# Patient Record
Sex: Female | Born: 1960 | Race: White | Hispanic: No | Marital: Married | Smoking: Never smoker
Health system: Southern US, Community
[De-identification: ages and names within clinical notes are randomized; demographics above are authoritative.]

## PROBLEM LIST (undated history)

## (undated) DIAGNOSIS — I10 Essential (primary) hypertension: Secondary | ICD-10-CM

## (undated) DIAGNOSIS — K219 Gastro-esophageal reflux disease without esophagitis: Secondary | ICD-10-CM

## (undated) DIAGNOSIS — J45909 Unspecified asthma, uncomplicated: Secondary | ICD-10-CM

## (undated) HISTORY — DX: Essential (primary) hypertension: I10

## (undated) HISTORY — DX: Gastro-esophageal reflux disease without esophagitis: K21.9

## (undated) HISTORY — DX: Unspecified asthma, uncomplicated: J45.909

---

## 2002-01-22 ENCOUNTER — Other Ambulatory Visit: Admission: RE | Admit: 2002-01-22 | Discharge: 2002-01-22 | Payer: Self-pay | Admitting: Unknown Physician Specialty

## 2011-02-09 ENCOUNTER — Ambulatory Visit
Admission: RE | Admit: 2011-02-09 | Discharge: 2011-02-09 | Disposition: A | Discharge status: Home / Self Care | Payer: BC Managed Care – PPO | Source: Ambulatory Visit | Attending: Allergy | Admitting: Allergy

## 2011-02-09 ENCOUNTER — Other Ambulatory Visit: Payer: Self-pay | Admitting: Allergy

## 2011-02-09 DIAGNOSIS — J45901 Unspecified asthma with (acute) exacerbation: Secondary | ICD-10-CM

## 2012-03-07 IMAGING — CR DG CHEST 2V
2 series · 2 of 2 positions shown · non-contrast
Comparison: None

CLINICAL DATA: Extrinsic asthma with acute exacerbation, chest
tightness, shortness of breath

CHEST - 2 VIEW

[view not recorded (1 of 2)]
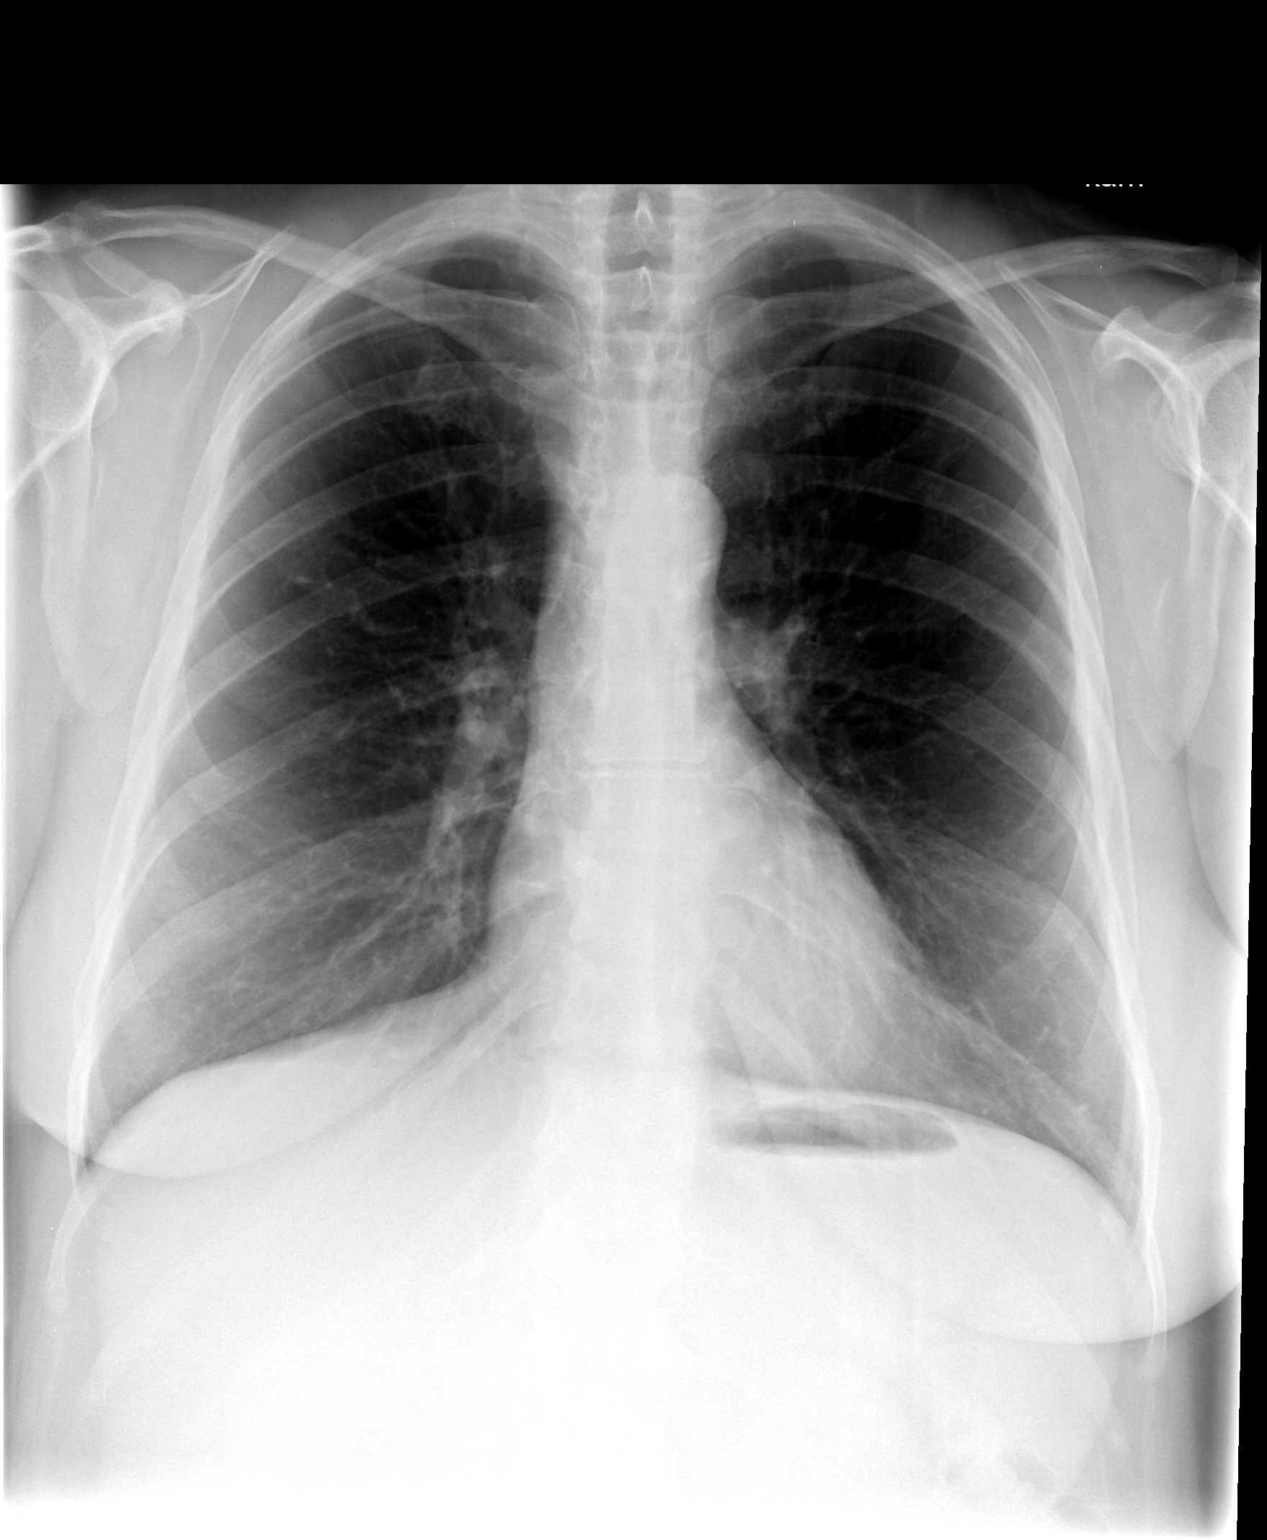

[view not recorded (2 of 2)]
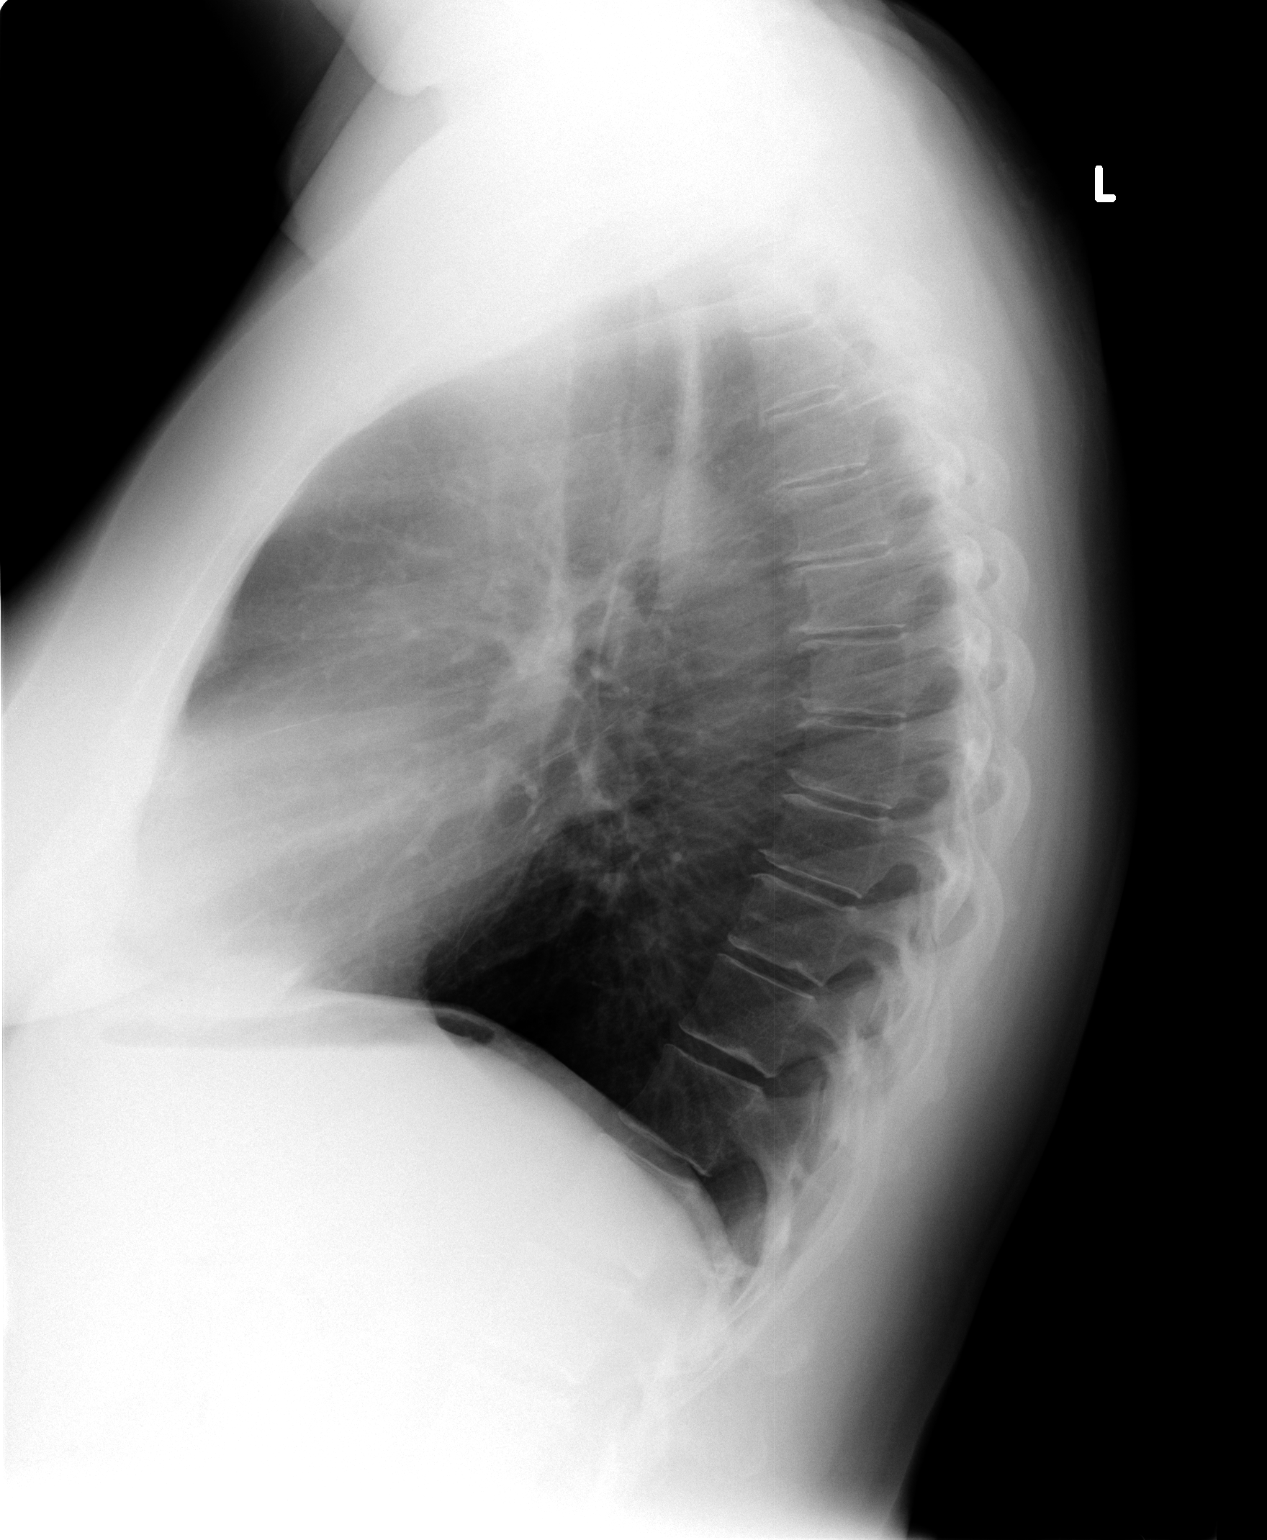

[2 of 2 positions shown; findings below may reference images not displayed]

FINDINGS: Normal heart size, mediastinal contours, and pulmonary vascularity.
Peribronchial thickening.
No pulmonary infiltrate or pleural effusion.
Upper normal lung volumes without significant air trapping.
Minimal scattered end plate spur formation mid thoracic spine.
IMPRESSION: Peribronchial thickening, which can be seen with bronchitis or
reactive airway disease.
No acute infiltrate.

## 2014-12-22 ENCOUNTER — Telehealth: Payer: Self-pay | Admitting: Internal Medicine

## 2014-12-22 NOTE — Telephone Encounter (Signed)
Opened in error

## 2016-12-01 ENCOUNTER — Other Ambulatory Visit: Payer: Self-pay | Admitting: Physician Assistant

## 2016-12-12 DIAGNOSIS — H1045 Other chronic allergic conjunctivitis: Secondary | ICD-10-CM | POA: Diagnosis not present

## 2016-12-12 DIAGNOSIS — J4530 Mild persistent asthma, uncomplicated: Secondary | ICD-10-CM | POA: Diagnosis not present

## 2016-12-12 DIAGNOSIS — J3089 Other allergic rhinitis: Secondary | ICD-10-CM | POA: Diagnosis not present

## 2016-12-12 DIAGNOSIS — J301 Allergic rhinitis due to pollen: Secondary | ICD-10-CM | POA: Diagnosis not present

## 2016-12-29 ENCOUNTER — Other Ambulatory Visit: Payer: Self-pay | Admitting: Physician Assistant

## 2017-01-17 ENCOUNTER — Other Ambulatory Visit: Payer: Self-pay | Admitting: Physician Assistant

## 2017-02-06 ENCOUNTER — Encounter: Payer: Self-pay | Admitting: Physician Assistant

## 2017-02-06 ENCOUNTER — Ambulatory Visit (INDEPENDENT_AMBULATORY_CARE_PROVIDER_SITE_OTHER): Payer: BLUE CROSS/BLUE SHIELD | Admitting: Physician Assistant

## 2017-02-06 VITALS — BP 176/88 | HR 65 | Temp 97.9°F | Ht 63.5 in | Wt 189.2 lb

## 2017-02-06 DIAGNOSIS — K219 Gastro-esophageal reflux disease without esophagitis: Secondary | ICD-10-CM

## 2017-02-06 DIAGNOSIS — F339 Major depressive disorder, recurrent, unspecified: Secondary | ICD-10-CM

## 2017-02-06 DIAGNOSIS — I10 Essential (primary) hypertension: Secondary | ICD-10-CM | POA: Diagnosis not present

## 2017-02-06 DIAGNOSIS — J4532 Mild persistent asthma with status asthmaticus: Secondary | ICD-10-CM

## 2017-02-06 MED ORDER — BUPROPION HCL ER (SR) 150 MG PO TB12: 150.0000 mg | ORAL_TABLET | Freq: Two times a day (BID) | ORAL | 1 refills | Status: DC

## 2017-02-06 MED ORDER — ESOMEPRAZOLE MAGNESIUM 20 MG PO CPDR
20.0000 mg | DELAYED_RELEASE_CAPSULE | Freq: Every day | ORAL | 3 refills | Status: DC
Start: 2017-02-06 — End: 2018-01-25

## 2017-02-06 MED ORDER — LISINOPRIL 40 MG PO TABS
40.0000 mg | ORAL_TABLET | Freq: Every day | ORAL | 3 refills | Status: DC
Start: 2017-02-06 — End: 2018-01-25

## 2017-02-06 MED ORDER — ALBUTEROL SULFATE HFA 108 (90 BASE) MCG/ACT IN AERS: 2.0000 | INHALATION_SPRAY | Freq: Four times a day (QID) | RESPIRATORY_TRACT | 11 refills | Status: DC | PRN

## 2017-02-06 NOTE — Patient Instructions (Signed)
DASH Eating Plan DASH stands for "Dietary Approaches to Stop Hypertension." The DASH eating plan is a healthy eating plan that has been shown to reduce high blood pressure (hypertension). It may also reduce your risk for type 2 diabetes, heart disease, and stroke. The DASH eating plan may also help with weight loss. What are tips for following this plan? General guidelines  Avoid eating more than 2,300 mg (milligrams) of salt (sodium) a day. If you have hypertension, you may need to reduce your sodium intake to 1,500 mg a day.  Limit alcohol intake to no more than 1 drink a day for nonpregnant women and 2 drinks a day for men. One drink equals 12 oz of beer, 5 oz of wine, or 1 oz of hard liquor.  Work with your health care provider to maintain a healthy body weight or to lose weight. Ask what an ideal weight is for you.  Get at least 30 minutes of exercise that causes your heart to beat faster (aerobic exercise) most days of the week. Activities may include walking, swimming, or biking.  Work with your health care provider or diet and nutrition specialist (dietitian) to adjust your eating plan to your individual calorie needs. Reading food labels  Check food labels for the amount of sodium per serving. Choose foods with less than 5 percent of the Daily Value of sodium. Generally, foods with less than 300 mg of sodium per serving fit into this eating plan.  To find whole grains, look for the word "whole" as the first word in the ingredient list. Shopping  Buy products labeled as "low-sodium" or "no salt added."  Buy fresh foods. Avoid canned foods and premade or frozen meals. Cooking  Avoid adding salt when cooking. Use salt-free seasonings or herbs instead of table salt or sea salt. Check with your health care provider or pharmacist before using salt substitutes.  Do not fry foods. Cook foods using healthy methods such as baking, boiling, grilling, and broiling instead.  Cook with  heart-healthy oils, such as olive, canola, soybean, or sunflower oil. Meal planning   Eat a balanced diet that includes: ? 5 or more servings of fruits and vegetables each day. At each meal, try to fill half of your plate with fruits and vegetables. ? Up to 6-8 servings of whole grains each day. ? Less than 6 oz of lean meat, poultry, or fish each day. A 3-oz serving of meat is about the same size as a deck of cards. One egg equals 1 oz. ? 2 servings of low-fat dairy each day. ? A serving of nuts, seeds, or beans 5 times each week. ? Heart-healthy fats. Healthy fats called Omega-3 fatty acids are found in foods such as flaxseeds and coldwater fish, like sardines, salmon, and mackerel.  Limit how much you eat of the following: ? Canned or prepackaged foods. ? Food that is high in trans fat, such as fried foods. ? Food that is high in saturated fat, such as fatty meat. ? Sweets, desserts, sugary drinks, and other foods with added sugar. ? Full-fat dairy products.  Do not salt foods before eating.  Try to eat at least 2 vegetarian meals each week.  Eat more home-cooked food and less restaurant, buffet, and fast food.  When eating at a restaurant, ask that your food be prepared with less salt or no salt, if possible. What foods are recommended? The items listed may not be a complete list. Talk with your dietitian about what   dietary choices are best for you. Grains Whole-grain or whole-wheat bread. Whole-grain or whole-wheat pasta. Brown rice. Oatmeal. Quinoa. Bulgur. Whole-grain and low-sodium cereals. Pita bread. Low-fat, low-sodium crackers. Whole-wheat flour tortillas. Vegetables Fresh or frozen vegetables (raw, steamed, roasted, or grilled). Low-sodium or reduced-sodium tomato and vegetable juice. Low-sodium or reduced-sodium tomato sauce and tomato paste. Low-sodium or reduced-sodium canned vegetables. Fruits All fresh, dried, or frozen fruit. Canned fruit in natural juice (without  added sugar). Meat and other protein foods Skinless chicken or turkey. Ground chicken or turkey. Pork with fat trimmed off. Fish and seafood. Egg whites. Dried beans, peas, or lentils. Unsalted nuts, nut butters, and seeds. Unsalted canned beans. Lean cuts of beef with fat trimmed off. Low-sodium, lean deli meat. Dairy Low-fat (1%) or fat-free (skim) milk. Fat-free, low-fat, or reduced-fat cheeses. Nonfat, low-sodium ricotta or cottage cheese. Low-fat or nonfat yogurt. Low-fat, low-sodium cheese. Fats and oils Soft margarine without trans fats. Vegetable oil. Low-fat, reduced-fat, or light mayonnaise and salad dressings (reduced-sodium). Canola, safflower, olive, soybean, and sunflower oils. Avocado. Seasoning and other foods Herbs. Spices. Seasoning mixes without salt. Unsalted popcorn and pretzels. Fat-free sweets. What foods are not recommended? The items listed may not be a complete list. Talk with your dietitian about what dietary choices are best for you. Grains Baked goods made with fat, such as croissants, muffins, or some breads. Dry pasta or rice meal packs. Vegetables Creamed or fried vegetables. Vegetables in a cheese sauce. Regular canned vegetables (not low-sodium or reduced-sodium). Regular canned tomato sauce and paste (not low-sodium or reduced-sodium). Regular tomato and vegetable juice (not low-sodium or reduced-sodium). Pickles. Olives. Fruits Canned fruit in a light or heavy syrup. Fried fruit. Fruit in cream or butter sauce. Meat and other protein foods Fatty cuts of meat. Ribs. Fried meat. Bacon. Sausage. Bologna and other processed lunch meats. Salami. Fatback. Hotdogs. Bratwurst. Salted nuts and seeds. Canned beans with added salt. Canned or smoked fish. Whole eggs or egg yolks. Chicken or turkey with skin. Dairy Whole or 2% milk, cream, and half-and-half. Whole or full-fat cream cheese. Whole-fat or sweetened yogurt. Full-fat cheese. Nondairy creamers. Whipped toppings.  Processed cheese and cheese spreads. Fats and oils Butter. Stick margarine. Lard. Shortening. Ghee. Bacon fat. Tropical oils, such as coconut, palm kernel, or palm oil. Seasoning and other foods Salted popcorn and pretzels. Onion salt, garlic salt, seasoned salt, table salt, and sea salt. Worcestershire sauce. Tartar sauce. Barbecue sauce. Teriyaki sauce. Soy sauce, including reduced-sodium. Steak sauce. Canned and packaged gravies. Fish sauce. Oyster sauce. Cocktail sauce. Horseradish that you find on the shelf. Ketchup. Mustard. Meat flavorings and tenderizers. Bouillon cubes. Hot sauce and Tabasco sauce. Premade or packaged marinades. Premade or packaged taco seasonings. Relishes. Regular salad dressings. Where to find more information:  National Heart, Lung, and Blood Institute: www.nhlbi.nih.gov  American Heart Association: www.heart.org Summary  The DASH eating plan is a healthy eating plan that has been shown to reduce high blood pressure (hypertension). It may also reduce your risk for type 2 diabetes, heart disease, and stroke.  With the DASH eating plan, you should limit salt (sodium) intake to 2,300 mg a day. If you have hypertension, you may need to reduce your sodium intake to 1,500 mg a day.  When on the DASH eating plan, aim to eat more fresh fruits and vegetables, whole grains, lean proteins, low-fat dairy, and heart-healthy fats.  Work with your health care provider or diet and nutrition specialist (dietitian) to adjust your eating plan to your individual   calorie needs. This information is not intended to replace advice given to you by your health care provider. Make sure you discuss any questions you have with your health care provider. Document Released: 08/31/2011 Document Revised: 09/04/2016 Document Reviewed: 09/04/2016 Elsevier Interactive Patient Education  2017 Elsevier Inc.  

## 2017-02-06 NOTE — Progress Notes (Signed)
BP (!) 176/88   Pulse 65   Temp 97.9 F (36.6 C) (Oral)   Ht 5' 3.5" (1.613 m)   Wt 189 lb 3.2 oz (85.8 kg)   LMP 02/07/2016 (Approximate)   BMI 32.99 kg/m    Subjective:    Patient ID: Autumn Ross, female    DOB: Sep 01, 1961, 56 y.o.   MRN: [id number]  Autumn Ross is a 56 y.o. female presenting on 02/06/2017 for Establish Care  HPI this patient comes in to establish care. I had seen her before at [facility]. Overall she reports she is doing well. She has been out of her medication for a few weeks. She currently takes Nexium for GERD, Symbicort control her asthma, and has been out of her lisinopril 40 mg for several weeks. We will plan to get this restarted. She also reports some feeling down and stressed. She is sad about weight gain. She has never taken Wellbutrin before. I have told her that this may help with some emotional eating. She is willing to start it.  Past Medical History:  Diagnosis Date  . Asthma   . GERD (gastroesophageal reflux disease)   . Hypertension    Relevant past medical, surgical, family and social history reviewed and updated as indicated. Interim medical history since our last visit reviewed. Allergies and medications reviewed and updated.   Data reviewed from any sources in EPIC.  Review of Systems  Constitutional: Negative.  Negative for activity change, fatigue and fever.  HENT: Negative.   Eyes: Negative.   Respiratory: Negative.  Negative for cough.   Cardiovascular: Negative.  Negative for chest pain.  Gastrointestinal: Negative.  Negative for abdominal pain.  Endocrine: Negative.   Genitourinary: Negative.  Negative for dysuria.  Musculoskeletal: Negative.   Skin: Negative.   Neurological: Negative.   Psychiatric/Behavioral: Positive for decreased concentration and dysphoric mood. The patient is nervous/anxious.      Social History   Social History  . Marital status: Married    Spouse name: N/A  . Number of  children: N/A  . Years of education: N/A   Occupational History  . Not on file.   Social History Main Topics  . Smoking status: Never Smoker  . Smokeless tobacco: Never Used  . Alcohol use Yes     Comment: occasional  . Drug use: No  . Sexual activity: Not on file   Other Topics Concern  . Not on file   Social History Narrative  . No narrative on file    History reviewed. No pertinent surgical history.  Family History  Problem Relation Age of Onset  . Cancer Mother     Allergies as of 02/06/2017   No Known Allergies     Medication List       Accurate as of 02/06/17 10:10 AM. Always use your most recent med list.          albuterol 108 (90 Base) MCG/ACT inhaler Commonly known as:  PROVENTIL HFA;VENTOLIN HFA Inhale 2 puffs into the lungs every 6 (six) hours as needed for wheezing or shortness of breath.   buPROPion 150 MG 12 hr tablet Commonly known as:  WELLBUTRIN SR Take 1 tablet (150 mg total) by mouth 2 (two) times daily.   esomeprazole 20 MG capsule Commonly known as:  NEXIUM Take 1 capsule (20 mg total) by mouth daily.   lisinopril 40 MG tablet Commonly known as:  PRINIVIL,ZESTRIL Take 1 tablet (40 mg total) by mouth daily.   SYMBICORT  160-4.5 MCG/ACT inhaler Generic drug:  budesonide-formoterol Inhale 2 puffs into the lungs 2 (two) times daily.          Objective:    BP (!) 176/88   Pulse 65   Temp 97.9 F (36.6 C) (Oral)   Ht 5' 3.5" (1.613 m)   Wt 189 lb 3.2 oz (85.8 kg)   LMP 02/07/2016 (Approximate)   BMI 32.99 kg/m   No Known Allergies Wt Readings from Last 3 Encounters:  02/06/17 189 lb 3.2 oz (85.8 kg)    Physical Exam  Constitutional: She is oriented to person, place, and time. She appears well-developed and well-nourished.  HENT:  Head: Normocephalic and atraumatic.  Right Ear: Tympanic membrane, external ear and ear canal normal.  Left Ear: Tympanic membrane, external ear and ear canal normal.  Nose: Nose normal. No  rhinorrhea.  Mouth/Throat: Oropharynx is clear and moist and mucous membranes are normal. No oropharyngeal exudate or posterior oropharyngeal erythema.  Eyes: Conjunctivae and EOM are normal. Pupils are equal, round, and reactive to light.  Neck: Normal range of motion. Neck supple.  Cardiovascular: Normal rate, regular rhythm, normal heart sounds and intact distal pulses.   Pulmonary/Chest: Effort normal and breath sounds normal.  Abdominal: Soft. Bowel sounds are normal.  Neurological: She is alert and oriented to person, place, and time. She has normal reflexes.  Skin: Skin is warm and dry. No rash noted.  Psychiatric: She has a normal mood and affect. Her behavior is normal. Judgment and thought content normal.    No results found for this or any previous visit.    Assessment & Plan:   1. Mild persistent asthma with status asthmaticus - SYMBICORT 160-4.5 MCG/ACT inhaler; Inhale 2 puffs into the lungs 2 (two) times daily.; Refill: 5 - albuterol (PROVENTIL HFA;VENTOLIN HFA) 108 (90 Base) MCG/ACT inhaler; Inhale 2 puffs into the lungs every 6 (six) hours as needed for wheezing or shortness of breath.  Dispense: 1 Inhaler; Refill: 11  2. Essential hypertension - lisinopril (PRINIVIL,ZESTRIL) 40 MG tablet; Take 1 tablet (40 mg total) by mouth daily.  Dispense: 90 tablet; Refill: 3  3. Gastroesophageal reflux disease without esophagitis - esomeprazole (NEXIUM) 20 MG capsule; Take 1 capsule (20 mg total) by mouth daily.  Dispense: 90 capsule; Refill: 3  4. Depression, recurrent (HCC) - buPROPion (WELLBUTRIN SR) 150 MG 12 hr tablet; Take 1 tablet (150 mg total) by mouth 2 (two) times daily.  Dispense: 60 tablet; Refill: 1   Continue all other maintenance medications as listed above. Educational handout given for DASH diet  Follow up plan: Return in about 4 weeks (around 03/06/2017) for recheck.  Remus LofflerAngel S. Jeliyah Middlebrooks PA-C [facility] [address]  [place], [place]  [id number] [phone]   02/06/2017, 10:10 AM

## 2017-03-06 ENCOUNTER — Encounter: Payer: Self-pay | Admitting: Physician Assistant

## 2017-03-06 ENCOUNTER — Ambulatory Visit (INDEPENDENT_AMBULATORY_CARE_PROVIDER_SITE_OTHER): Payer: BLUE CROSS/BLUE SHIELD | Admitting: Physician Assistant

## 2017-03-06 VITALS — BP 141/80 | HR 70 | Temp 97.6°F | Ht 63.0 in | Wt 186.0 lb

## 2017-03-06 DIAGNOSIS — I10 Essential (primary) hypertension: Secondary | ICD-10-CM

## 2017-03-06 DIAGNOSIS — F339 Major depressive disorder, recurrent, unspecified: Secondary | ICD-10-CM

## 2017-03-06 MED ORDER — BUPROPION HCL ER (SR) 150 MG PO TB12: 150.0000 mg | ORAL_TABLET | Freq: Two times a day (BID) | ORAL | 11 refills | Status: DC

## 2017-03-06 NOTE — Patient Instructions (Signed)

## 2017-03-07 NOTE — Progress Notes (Signed)
BP (!) 141/80   Pulse 70   Temp 97.6 F (36.4 C) (Oral)   Ht 5\' 3"  (1.6 m)   Wt 186 lb (84.4 kg)   BMI 32.95 kg/m    Subjective:    Patient ID: Autumn SaxonSheila Stargell, female    DOB: 07/07/1961, 56 y.o.   MRN: [id number]  HPI: Autumn Ross is a 56 y.o. female presenting on 03/06/2017 for 4 week recheck  This patient comes in for periodic recheck on medications and conditions including Hypertension and depression. She has had a great improvement in her blood pressure. At her last visit she was 176/88 at the best check and this point she is 141/80. She has had even lower ones at her home reading. She reports overall she is feeling good on the medication. We will continue the medication and plan to recheck her in a few months..   All medications are reviewed today. There are no reports of any problems with the medications. All of the medical conditions are reviewed and updated.  Lab work is reviewed and will be ordered as medically necessary. There are no new problems reported with today's visit.   Relevant past medical, surgical, family and social history reviewed and updated as indicated. Allergies and medications reviewed and updated.  Past Medical History:  Diagnosis Date  . Asthma   . GERD (gastroesophageal reflux disease)   . Hypertension     No past surgical history on file.  Review of Systems  Constitutional: Negative.  Negative for activity change, fatigue and fever.  HENT: Negative.   Eyes: Negative.   Respiratory: Negative.  Negative for cough.   Cardiovascular: Negative.  Negative for chest pain.  Gastrointestinal: Negative.  Negative for abdominal pain.  Endocrine: Negative.   Genitourinary: Negative.  Negative for dysuria.  Musculoskeletal: Negative.   Skin: Negative.   Neurological: Negative.     Allergies as of 03/06/2017   No Known Allergies     Medication List       Accurate as of 03/06/17 11:59 PM. Always use your most recent med list.            albuterol 108 (90 Base) MCG/ACT inhaler Commonly known as:  PROVENTIL HFA;VENTOLIN HFA Inhale 2 puffs into the lungs every 6 (six) hours as needed for wheezing or shortness of breath.   buPROPion 150 MG 12 hr tablet Commonly known as:  WELLBUTRIN SR Take 1 tablet (150 mg total) by mouth 2 (two) times daily.   esomeprazole 20 MG capsule Commonly known as:  NEXIUM Take 1 capsule (20 mg total) by mouth daily.   lisinopril 40 MG tablet Commonly known as:  PRINIVIL,ZESTRIL Take 1 tablet (40 mg total) by mouth daily.   SYMBICORT 160-4.5 MCG/ACT inhaler Generic drug:  budesonide-formoterol Inhale 2 puffs into the lungs 2 (two) times daily.          Objective:    BP (!) 141/80   Pulse 70   Temp 97.6 F (36.4 C) (Oral)   Ht 5\' 3"  (1.6 m)   Wt 186 lb (84.4 kg)   BMI 32.95 kg/m   No Known Allergies  Physical Exam  Constitutional: She is oriented to person, place, and time. She appears well-developed and well-nourished.  HENT:  Head: Normocephalic and atraumatic.  Eyes: Conjunctivae and EOM are normal. Pupils are equal, round, and reactive to light.  Cardiovascular: Normal rate, regular rhythm, normal heart sounds and intact distal pulses.   Pulmonary/Chest: Effort normal and breath sounds normal.  Abdominal: Soft. Bowel sounds are normal.  Neurological: She is alert and oriented to person, place, and time. She has normal reflexes.  Skin: Skin is warm and dry. No rash noted.  Psychiatric: She has a normal mood and affect. Her behavior is normal. Judgment and thought content normal.  Nursing note and vitals reviewed.   No results found for this or any previous visit.    Assessment & Plan:   1. Depression, recurrent (HCC) - buPROPion (WELLBUTRIN SR) 150 MG 12 hr tablet; Take 1 tablet (150 mg total) by mouth 2 (two) times daily.  Dispense: 60 tablet; Refill: 11  2. Essential hypertension   Current Outpatient Prescriptions:  .  albuterol (PROVENTIL HFA;VENTOLIN HFA) 108  (90 Base) MCG/ACT inhaler, Inhale 2 puffs into the lungs every 6 (six) hours as needed for wheezing or shortness of breath., Disp: 1 Inhaler, Rfl: 11 .  buPROPion (WELLBUTRIN SR) 150 MG 12 hr tablet, Take 1 tablet (150 mg total) by mouth 2 (two) times daily., Disp: 60 tablet, Rfl: 11 .  esomeprazole (NEXIUM) 20 MG capsule, Take 1 capsule (20 mg total) by mouth daily., Disp: 90 capsule, Rfl: 3 .  lisinopril (PRINIVIL,ZESTRIL) 40 MG tablet, Take 1 tablet (40 mg total) by mouth daily., Disp: 90 tablet, Rfl: 3 .  SYMBICORT 160-4.5 MCG/ACT inhaler, Inhale 2 puffs into the lungs 2 (two) times daily., Disp: , Rfl: 5  Continue all other maintenance medications as listed above.  Follow up plan: Return in about 3 months (around 06/06/2017).  Educational handout given for hypertension  Remus Loffler PA-C [facility] [address]  [place], [place] [address] [phone]   03/07/2017, 8:55 AM

## 2017-04-11 ENCOUNTER — Other Ambulatory Visit: Payer: Self-pay | Admitting: *Deleted

## 2017-04-11 DIAGNOSIS — F339 Major depressive disorder, recurrent, unspecified: Secondary | ICD-10-CM

## 2017-04-11 MED ORDER — BUPROPION HCL ER (SR) 150 MG PO TB12: 150.0000 mg | ORAL_TABLET | Freq: Two times a day (BID) | ORAL | 2 refills | Status: DC

## 2017-05-05 ENCOUNTER — Other Ambulatory Visit: Payer: Self-pay | Admitting: Physician Assistant

## 2017-05-05 DIAGNOSIS — F339 Major depressive disorder, recurrent, unspecified: Secondary | ICD-10-CM

## 2017-06-06 ENCOUNTER — Ambulatory Visit: Payer: BLUE CROSS/BLUE SHIELD | Admitting: Physician Assistant

## 2017-06-08 ENCOUNTER — Other Ambulatory Visit: Payer: Self-pay | Admitting: Physician Assistant

## 2017-06-08 DIAGNOSIS — F339 Major depressive disorder, recurrent, unspecified: Secondary | ICD-10-CM

## 2017-06-26 ENCOUNTER — Ambulatory Visit: Payer: BLUE CROSS/BLUE SHIELD | Admitting: Physician Assistant

## 2017-07-18 ENCOUNTER — Ambulatory Visit (INDEPENDENT_AMBULATORY_CARE_PROVIDER_SITE_OTHER): Payer: BLUE CROSS/BLUE SHIELD | Admitting: Physician Assistant

## 2017-07-18 ENCOUNTER — Encounter: Payer: Self-pay | Admitting: Physician Assistant

## 2017-07-18 VITALS — BP 129/76 | HR 72 | Temp 98.4°F | Ht 63.0 in | Wt 182.0 lb

## 2017-07-18 DIAGNOSIS — I10 Essential (primary) hypertension: Secondary | ICD-10-CM | POA: Diagnosis not present

## 2017-07-18 DIAGNOSIS — F339 Major depressive disorder, recurrent, unspecified: Secondary | ICD-10-CM | POA: Diagnosis not present

## 2017-07-18 DIAGNOSIS — J4540 Moderate persistent asthma, uncomplicated: Secondary | ICD-10-CM

## 2017-07-18 NOTE — Patient Instructions (Signed)
In a few days you may receive a survey in the mail or online from [employer] regarding your visit with us today. Please take a moment to fill this out. Your feedback is very important to our whole office. It can help us better understand your needs as well as improve your experience and satisfaction. Thank you for taking your time to complete it. We care about you.  Toy Eisemann, PA-C  

## 2017-07-20 DIAGNOSIS — J4540 Moderate persistent asthma, uncomplicated: Secondary | ICD-10-CM | POA: Insufficient documentation

## 2017-07-20 NOTE — Progress Notes (Signed)
BP 129/76   Pulse 72   Temp 98.4 F (36.9 C) (Oral)   Ht 5\' 3"  (1.6 m)   Wt 182 lb (82.6 kg)   BMI 32.24 kg/m    Subjective:    Patient ID: Autumn Ross, female    DOB: March 20, 1961, 56 y.o.   MRN: [id number]  HPI: Autumn Ross is a 56 y.o. female presenting on 07/18/2017 for Follow-up (3 month ) This patient comes in for periodic recheck on medications and conditions including hypertension, depression and asthma. Overall reports doing well. Medications are stable.Having more symptoms at times with asthma due to weather changes..   All medications are reviewed today. There are no reports of any problems with the medications. All of the medical conditions are reviewed and updated.  Lab work is reviewed and will be ordered as medically necessary. There are no new problems reported with today's visit.    Relevant past medical, surgical, family and social history reviewed and updated as indicated. Allergies and medications reviewed and updated.  Past Medical History:  Diagnosis Date  . Asthma   . GERD (gastroesophageal reflux disease)   . Hypertension     History reviewed. No pertinent surgical history.  Review of Systems  Constitutional: Negative.   HENT: Negative.   Eyes: Negative.   Respiratory: Negative.   Gastrointestinal: Negative.   Genitourinary: Negative.     Allergies as of 07/18/2017   No Known Allergies     Medication List       Accurate as of 07/18/17 11:59 PM. Always use your most recent med list.          albuterol 108 (90 Base) MCG/ACT inhaler Commonly known as:  PROVENTIL HFA;VENTOLIN HFA Inhale 2 puffs into the lungs every 6 (six) hours as needed for wheezing or shortness of breath.   azelastine 0.05 % ophthalmic solution Commonly known as:  OPTIVAR   buPROPion 150 MG 12 hr tablet Commonly known as:  WELLBUTRIN SR Take 1 tablet (150 mg total) by mouth 2 (two) times daily.   esomeprazole 20 MG capsule Commonly known as:  NEXIUM Take  1 capsule (20 mg total) by mouth daily.   lisinopril 40 MG tablet Commonly known as:  PRINIVIL,ZESTRIL Take 1 tablet (40 mg total) by mouth daily.   SYMBICORT 160-4.5 MCG/ACT inhaler Generic drug:  budesonide-formoterol Inhale 2 puffs into the lungs 2 (two) times daily.          Objective:    BP 129/76   Pulse 72   Temp 98.4 F (36.9 C) (Oral)   Ht 5\' 3"  (1.6 m)   Wt 182 lb (82.6 kg)   BMI 32.24 kg/m   No Known Allergies  Physical Exam  Constitutional: She is oriented to person, place, and time. She appears well-developed and well-nourished.  HENT:  Head: Normocephalic and atraumatic.  Eyes: Pupils are equal, round, and reactive to light. Conjunctivae and EOM are normal.  Cardiovascular: Normal rate, regular rhythm, normal heart sounds and intact distal pulses.   Pulmonary/Chest: Effort normal and breath sounds normal.  Abdominal: Soft. Bowel sounds are normal.  Neurological: She is alert and oriented to person, place, and time. She has normal reflexes.  Skin: Skin is warm and dry. No rash noted.  Psychiatric: She has a normal mood and affect. Her behavior is normal. Judgment and thought content normal.  Nursing note and vitals reviewed.   No results found for this or any previous visit.    Assessment & Plan:  1. Depression, recurrent (HCC)  2. Essential hypertension  3. Moderate persistent asthma without complication     Current Outpatient Prescriptions:  .  albuterol (PROVENTIL HFA;VENTOLIN HFA) 108 (90 Base) MCG/ACT inhaler, Inhale 2 puffs into the lungs every 6 (six) hours as needed for wheezing or shortness of breath., Disp: 1 Inhaler, Rfl: 11 .  buPROPion (WELLBUTRIN SR) 150 MG 12 hr tablet, Take 1 tablet (150 mg total) by mouth 2 (two) times daily., Disp: 180 tablet, Rfl: 2 .  esomeprazole (NEXIUM) 20 MG capsule, Take 1 capsule (20 mg total) by mouth daily., Disp: 90 capsule, Rfl: 3 .  lisinopril (PRINIVIL,ZESTRIL) 40 MG tablet, Take 1 tablet (40 mg  total) by mouth daily., Disp: 90 tablet, Rfl: 3 .  SYMBICORT 160-4.5 MCG/ACT inhaler, Inhale 2 puffs into the lungs 2 (two) times daily., Disp: , Rfl: 5 .  azelastine (OPTIVAR) 0.05 % ophthalmic solution, , Disp: , Rfl: 2 Continue all other maintenance medications as listed above.  Follow up plan: Return in about 6 months (around 01/16/2018) for recheck.  Educational handout given for survey  Remus LofflerAngel S. Berdena Cisek PA-C [facility] [address]  [place], [place] [address] [phone]   07/20/2017, 9:56 AM

## 2017-09-28 ENCOUNTER — Encounter: Payer: Self-pay | Admitting: *Deleted

## 2018-01-17 ENCOUNTER — Ambulatory Visit: Payer: BLUE CROSS/BLUE SHIELD | Admitting: Physician Assistant

## 2018-01-22 ENCOUNTER — Ambulatory Visit: Payer: BLUE CROSS/BLUE SHIELD | Admitting: Physician Assistant

## 2018-01-25 ENCOUNTER — Encounter: Payer: Self-pay | Admitting: Physician Assistant

## 2018-01-25 ENCOUNTER — Ambulatory Visit: Payer: BLUE CROSS/BLUE SHIELD | Admitting: Physician Assistant

## 2018-01-25 VITALS — BP 147/80 | HR 73 | Temp 98.5°F | Ht 63.0 in | Wt 180.8 lb

## 2018-01-25 DIAGNOSIS — K219 Gastro-esophageal reflux disease without esophagitis: Secondary | ICD-10-CM | POA: Diagnosis not present

## 2018-01-25 DIAGNOSIS — Z0000 Encounter for general adult medical examination without abnormal findings: Secondary | ICD-10-CM

## 2018-01-25 DIAGNOSIS — I10 Essential (primary) hypertension: Secondary | ICD-10-CM | POA: Diagnosis not present

## 2018-01-25 DIAGNOSIS — J4532 Mild persistent asthma with status asthmaticus: Secondary | ICD-10-CM | POA: Diagnosis not present

## 2018-01-25 DIAGNOSIS — F339 Major depressive disorder, recurrent, unspecified: Secondary | ICD-10-CM

## 2018-01-25 MED ORDER — ALBUTEROL SULFATE HFA 108 (90 BASE) MCG/ACT IN AERS: 2.0000 | INHALATION_SPRAY | Freq: Four times a day (QID) | RESPIRATORY_TRACT | 4 refills | Status: DC | PRN

## 2018-01-25 MED ORDER — LISINOPRIL 40 MG PO TABS: 40.0000 mg | ORAL_TABLET | Freq: Every day | ORAL | 3 refills | Status: DC

## 2018-01-25 MED ORDER — BUPROPION HCL ER (SR) 150 MG PO TB12: 150.0000 mg | ORAL_TABLET | Freq: Two times a day (BID) | ORAL | 3 refills | Status: DC

## 2018-01-25 MED ORDER — ESOMEPRAZOLE MAGNESIUM 20 MG PO CPDR: 20.0000 mg | DELAYED_RELEASE_CAPSULE | Freq: Every day | ORAL | 3 refills | Status: DC

## 2018-01-25 MED ORDER — SYMBICORT 160-4.5 MCG/ACT IN AERO: 2.0000 | INHALATION_SPRAY | Freq: Two times a day (BID) | RESPIRATORY_TRACT | 11 refills | Status: DC

## 2018-01-27 NOTE — Progress Notes (Signed)
BP (!) 147/80   Pulse 73   Temp 98.5 F (36.9 C) (Oral)   Ht '5\' 3"'$  (1.6 m)   Wt 180 lb 12.8 oz (82 kg)   BMI 32.03 kg/m    Subjective:    Patient ID: Autumn Ross, female    DOB: May 23, 1961, 57 y.o.   MRN: [id number]  HPI: [name] is a 57 y.o. female presenting on 01/25/2018 for Hypertension; Gastroesophageal Reflux; Depression; and Asthma  This patient comes in for recheck on her conditions.  She does have problems with hypertension, depression, GERD, asthma.  She does need some refills on things.  She does need labs to be performed.  She would like to have a mammogram done here at  our office.  She states overall her conditions have been fairly well controlled.  Even her depression is been quite good.  She does need refills on medications.  She is going to check in with her insurance to see if Cologuard is covered.  Past Medical History:  Diagnosis Date  . Asthma   . GERD (gastroesophageal reflux disease)   . Hypertension    Relevant past medical, surgical, family and social history reviewed and updated as indicated. Interim medical history since our last visit reviewed. Allergies and medications reviewed and updated. DATA REVIEWED: CHART IN EPIC  Family History reviewed for pertinent findings.  Review of Systems  Constitutional: Negative.  Negative for activity change, fatigue and fever.  HENT: Negative.   Eyes: Negative.   Respiratory: Negative.  Negative for cough, shortness of breath and wheezing.   Cardiovascular: Negative.  Negative for chest pain.  Gastrointestinal: Negative.  Negative for abdominal pain.  Endocrine: Negative.   Genitourinary: Negative.  Negative for dysuria.  Musculoskeletal: Negative.   Skin: Negative.   Neurological: Negative.     Allergies as of 01/25/2018   No Known Allergies     Medication List        Accurate as of 01/25/18 11:59 PM. Always use your most recent med list.          albuterol 108 (90 Base) MCG/ACT  inhaler Commonly known as:  PROVENTIL HFA;VENTOLIN HFA Inhale 2 puffs into the lungs every 6 (six) hours as needed for wheezing or shortness of breath.   azelastine 0.05 % ophthalmic solution Commonly known as:  OPTIVAR   buPROPion 150 MG 12 hr tablet Commonly known as:  WELLBUTRIN SR Take 1 tablet (150 mg total) by mouth 2 (two) times daily.   esomeprazole 20 MG capsule Commonly known as:  NEXIUM Take 1 capsule (20 mg total) by mouth daily.   lisinopril 40 MG tablet Commonly known as:  PRINIVIL,ZESTRIL Take 1 tablet (40 mg total) by mouth daily.   SYMBICORT 160-4.5 MCG/ACT inhaler Generic drug:  budesonide-formoterol Inhale 2 puffs into the lungs 2 (two) times daily.          Objective:    BP (!) 147/80   Pulse 73   Temp 98.5 F (36.9 C) (Oral)   Ht '5\' 3"'$  (1.6 m)   Wt 180 lb 12.8 oz (82 kg)   BMI 32.03 kg/m   No Known Allergies  Wt Readings from Last 3 Encounters:  01/25/18 180 lb 12.8 oz (82 kg)  07/18/17 182 lb (82.6 kg)  03/06/17 186 lb (84.4 kg)    Physical Exam  Constitutional: She is oriented to person, place, and time. She appears well-developed and well-nourished.  HENT:  Head: Normocephalic and atraumatic.  Right Ear: Tympanic  membrane, external ear and ear canal normal.  Left Ear: Tympanic membrane, external ear and ear canal normal.  Nose: Nose normal. No rhinorrhea.  Mouth/Throat: Oropharynx is clear and moist and mucous membranes are normal. No oropharyngeal exudate or posterior oropharyngeal erythema.  Eyes: Pupils are equal, round, and reactive to light. Conjunctivae and EOM are normal.  Neck: Normal range of motion. Neck supple.  Cardiovascular: Normal rate, regular rhythm, normal heart sounds and intact distal pulses.  Pulmonary/Chest: Effort normal and breath sounds normal.  Abdominal: Soft. Bowel sounds are normal.  Neurological: She is alert and oriented to person, place, and time. She has normal reflexes.  Skin: Skin is warm and dry. No  rash noted.  Psychiatric: She has a normal mood and affect. Her behavior is normal. Judgment and thought content normal.    No results found for this or any previous visit.    Assessment & Plan:   1. Well adult exam - CMP14+EGFR; Future - CBC with Differential/Platelet; Future - Lipid panel; Future - TSH; Future  2. Depression, recurrent ([place]) - CMP14+EGFR; Future - CBC with Differential/Platelet; Future - TSH; Future - buPROPion (WELLBUTRIN SR) 150 MG 12 hr tablet; Take 1 tablet (150 mg total) by mouth 2 (two) times daily.  Dispense: 180 tablet; Refill: 3  3. Essential hypertension - lisinopril (PRINIVIL,ZESTRIL) 40 MG tablet; Take 1 tablet (40 mg total) by mouth daily.  Dispense: 90 tablet; Refill: 3  4. Gastroesophageal reflux disease without esophagitis - esomeprazole (NEXIUM) 20 MG capsule; Take 1 capsule (20 mg total) by mouth daily.  Dispense: 90 capsule; Refill: 3  5. Mild persistent asthma with status asthmaticus - albuterol (PROVENTIL HFA;VENTOLIN HFA) 108 (90 Base) MCG/ACT inhaler; Inhale 2 puffs into the lungs every 6 (six) hours as needed for wheezing or shortness of breath.  Dispense: 1 Inhaler; Refill: 4 - SYMBICORT 160-4.5 MCG/ACT inhaler; Inhale 2 puffs into the lungs 2 (two) times daily.  Dispense: 1 Inhaler; Refill: 11   Continue all other maintenance medications as listed above.  Follow up plan: Return in about 1 year (around 01/26/2019) for recheck.  Educational handout given for [place] PA-C [facility] [address]  [place], [place] [address] [phone]   01/27/2018, 10:15 PM

## 2018-03-05 ENCOUNTER — Other Ambulatory Visit: Payer: Self-pay | Admitting: Physician Assistant

## 2018-03-05 MED ORDER — AMOXICILLIN 500 MG PO CAPS: 500.0000 mg | ORAL_CAPSULE | Freq: Three times a day (TID) | ORAL | 0 refills | Status: DC

## 2018-10-01 ENCOUNTER — Other Ambulatory Visit: Payer: Self-pay | Admitting: Physician Assistant

## 2018-10-01 DIAGNOSIS — F339 Major depressive disorder, recurrent, unspecified: Secondary | ICD-10-CM

## 2019-01-27 ENCOUNTER — Ambulatory Visit: Payer: BLUE CROSS/BLUE SHIELD | Admitting: Physician Assistant

## 2019-01-27 ENCOUNTER — Other Ambulatory Visit: Payer: Self-pay

## 2019-01-27 ENCOUNTER — Encounter: Payer: Self-pay | Admitting: Physician Assistant

## 2019-01-27 DIAGNOSIS — K219 Gastro-esophageal reflux disease without esophagitis: Secondary | ICD-10-CM

## 2019-01-27 DIAGNOSIS — J4522 Mild intermittent asthma with status asthmaticus: Secondary | ICD-10-CM

## 2019-01-27 DIAGNOSIS — F339 Major depressive disorder, recurrent, unspecified: Secondary | ICD-10-CM | POA: Diagnosis not present

## 2019-01-27 DIAGNOSIS — J4532 Mild persistent asthma with status asthmaticus: Secondary | ICD-10-CM

## 2019-01-27 DIAGNOSIS — I10 Essential (primary) hypertension: Secondary | ICD-10-CM

## 2019-01-27 MED ORDER — LISINOPRIL 40 MG PO TABS: 40.0000 mg | ORAL_TABLET | Freq: Every day | ORAL | 3 refills | Status: DC

## 2019-01-27 MED ORDER — ESOMEPRAZOLE MAGNESIUM 20 MG PO CPDR: 20.0000 mg | DELAYED_RELEASE_CAPSULE | Freq: Every day | ORAL | 3 refills | Status: AC

## 2019-01-27 MED ORDER — ALBUTEROL SULFATE HFA 108 (90 BASE) MCG/ACT IN AERS: 2.0000 | INHALATION_SPRAY | Freq: Four times a day (QID) | RESPIRATORY_TRACT | 4 refills | Status: AC | PRN

## 2019-01-27 MED ORDER — SYMBICORT 160-4.5 MCG/ACT IN AERO: 2.0000 | INHALATION_SPRAY | Freq: Two times a day (BID) | RESPIRATORY_TRACT | 11 refills | Status: DC

## 2019-01-27 MED ORDER — BUPROPION HCL ER (SR) 150 MG PO TB12: 150.0000 mg | ORAL_TABLET | Freq: Two times a day (BID) | ORAL | 1 refills | Status: DC

## 2019-01-29 NOTE — Progress Notes (Signed)
BP 115/66   Pulse 76   Temp 98.6 F (37 C) (Oral)   Ht 5\' 3"  (1.6 m)   Wt 189 lb 12.8 oz (86.1 kg)   BMI 33.62 kg/m    Subjective:    Patient ID: Autumn Ross, female    DOB: 09/25/1960, 58 y.o.   MRN: [id number]  HPI: Autumn Ross is a 58 y.o. female presenting on 01/27/2019 for Hypertension  This patient comes in for periodic recheck on medications and conditions including pressure, GERD, hypertension, asthma,.  She reports that she really is doing very well overall.  She has not had any difficulty with her breathing.  She has been pretty good the spring.  Pressure has been well controlled.  She does need refills on her medications.  [facility] 2[facility] 01/27/2019 01/25/2018 07/18/2017 03/06/2017 02/06/2017  Decreased Interest 0 1 0 1 1  Down, Depressed, Hopeless 0 1 1 2 1   PHQ - 2 Score 0 2 1 3 2   Altered sleeping - 1 - 1 1  Tired, decreased energy - 2 - 2 2  Change in appetite - 2 - 1 1  Feeling bad or failure about yourself  - 1 - 2 2  Trouble concentrating - 1 - 1 1  Moving slowly or fidgety/restless - 0 - 0 0  Suicidal thoughts - 0 - 0 0  PHQ-9 Score - 9 - 10 9     All medications are reviewed today. There are no reports of any problems with the medications. All of the medical conditions are reviewed and updated.  Lab work is reviewed and will be ordered as medically necessary. There are no new problems reported with today's visit.   Past Medical History:  Diagnosis Date  . Asthma   . GERD (gastroesophageal reflux disease)   . Hypertension    Relevant past medical, surgical, family and social history reviewed and updated as indicated. Interim medical history since our last visit reviewed. Allergies and medications reviewed and updated. DATA REVIEWED: CHART IN EPIC  Family History reviewed for pertinent findings.  Review of Systems  Constitutional: Negative.   HENT: Negative.   Eyes: Negative.   Respiratory: Negative.   Gastrointestinal: Negative.    Genitourinary: Negative.     Allergies as of 01/27/2019   No Known Allergies     Medication List       Accurate as of Jan 27, 2019 11:59 PM. Always use your most recent med list.        albuterol 108 (90 Base) MCG/ACT inhaler Commonly known as:  VENTOLIN HFA Inhale 2 puffs into the lungs every 6 (six) hours as needed for wheezing or shortness of breath.   azelastine 0.05 % ophthalmic solution Commonly known as:  OPTIVAR   buPROPion 150 MG 12 hr tablet Commonly known as:  WELLBUTRIN SR Take 1 tablet (150 mg total) by mouth 2 (two) times daily.   esomeprazole 20 MG capsule Commonly known as:  NEXIUM Take 1 capsule (20 mg total) by mouth daily.   lisinopril 40 MG tablet Commonly known as:  ZESTRIL Take 1 tablet (40 mg total) by mouth daily.   Symbicort 160-4.5 MCG/ACT inhaler Generic drug:  budesonide-formoterol Inhale 2 puffs into the lungs 2 (two) times daily.          Objective:    BP 115/66   Pulse 76   Temp 98.6 F (37 C) (Oral)   Ht 5\' 3"  (1.6 m)   Wt 189 lb 12.8 oz (  86.1 kg)   BMI 33.62 kg/m   No Known Allergies  Wt Readings from Last 3 Encounters:  01/27/19 189 lb 12.8 oz (86.1 kg)  01/25/18 180 lb 12.8 oz (82 kg)  07/18/17 182 lb (82.6 kg)    Physical Exam Constitutional:      Appearance: She is well-developed.  HENT:     Head: Normocephalic and atraumatic.  Eyes:     Conjunctiva/sclera: Conjunctivae normal.     Pupils: Pupils are equal, round, and reactive to light.  Cardiovascular:     Rate and Rhythm: Normal rate and regular rhythm.     Heart sounds: Normal heart sounds.  Pulmonary:     Effort: Pulmonary effort is normal.     Breath sounds: Normal breath sounds.  Abdominal:     General: Bowel sounds are normal.     Palpations: Abdomen is soft.  Skin:    General: Skin is warm and dry.     Findings: No rash.  Neurological:     Mental Status: She is alert and oriented to person, place, and time.     Deep Tendon Reflexes: Reflexes are  normal and symmetric.  Psychiatric:        Behavior: Behavior normal.        Thought Content: Thought content normal.        Judgment: Judgment normal.     No results found for this or any previous visit.    Assessment & Plan:   1. Depression, recurrent (HCC) - buPROPion (WELLBUTRIN SR) 150 MG 12 hr tablet; Take 1 tablet (150 mg total) by mouth 2 (two) times daily.  Dispense: 180 tablet; Refill: 1  2. Gastroesophageal reflux disease without esophagitis - esomeprazole (NEXIUM) 20 MG capsule; Take 1 capsule (20 mg total) by mouth daily.  Dispense: 90 capsule; Refill: 3  3. Essential hypertension - lisinopril (ZESTRIL) 40 MG tablet; Take 1 tablet (40 mg total) by mouth daily.  Dispense: 90 tablet; Refill: 3  4. Mild persistent asthma without status asthmaticus - SYMBICORT 160-4.5 MCG/ACT inhaler; Inhale 2 puffs into the lungs 2 (two) times daily.  Dispense: 1 Inhaler; Refill: 11 - albuterol (VENTOLIN HFA) 108 (90 Base) MCG/ACT inhaler; Inhale 2 puffs into the lungs every 6 (six) hours as needed for wheezing or shortness of breath.  Dispense: 1 Inhaler; Refill: 4   Continue all other maintenance medications as listed above.  Follow up plan: Recheck 6 months  Educational handout given for survey  Remus Loffler PA-C [facility] [address]  [place], [place] [address] [phone]   01/29/2019, 1:44 PM

## 2019-07-30 ENCOUNTER — Ambulatory Visit: Payer: BLUE CROSS/BLUE SHIELD | Admitting: Physician Assistant

## 2019-08-01 ENCOUNTER — Telehealth: Payer: Self-pay | Admitting: Physician Assistant

## 2019-08-01 NOTE — Telephone Encounter (Signed)
lmtcb to print card offline or call us back and we can see if Ronnald Ramp will sent something else in

## 2019-08-08 ENCOUNTER — Other Ambulatory Visit: Payer: Self-pay

## 2019-08-11 ENCOUNTER — Encounter: Payer: Self-pay | Admitting: Physician Assistant

## 2019-08-11 ENCOUNTER — Ambulatory Visit (INDEPENDENT_AMBULATORY_CARE_PROVIDER_SITE_OTHER): Payer: BC Managed Care – PPO | Admitting: Physician Assistant

## 2019-08-11 VITALS — BP 138/75 | HR 72 | Temp 97.5°F | Ht 63.0 in | Wt 190.6 lb

## 2019-08-11 DIAGNOSIS — F339 Major depressive disorder, recurrent, unspecified: Secondary | ICD-10-CM | POA: Diagnosis not present

## 2019-08-11 DIAGNOSIS — F419 Anxiety disorder, unspecified: Secondary | ICD-10-CM | POA: Diagnosis not present

## 2019-08-11 MED ORDER — BUSPIRONE HCL 10 MG PO TABS: 10.0000 mg | ORAL_TABLET | Freq: Two times a day (BID) | ORAL | 11 refills | Status: DC

## 2019-08-11 MED ORDER — BUPROPION HCL ER (SR) 150 MG PO TB12: 150.0000 mg | ORAL_TABLET | Freq: Two times a day (BID) | ORAL | 3 refills | Status: AC

## 2019-08-11 NOTE — Patient Instructions (Signed)

## 2019-08-14 ENCOUNTER — Telehealth: Payer: Self-pay | Admitting: Physician Assistant

## 2019-08-17 NOTE — Progress Notes (Signed)
BP 138/75   Pulse 72   Temp (!) 97.5 F (36.4 C) (Temporal)   Ht 5\' 3"  (1.6 m)   Wt 190 lb 9.6 oz (86.5 kg)   SpO2 99%   BMI 33.76 kg/m    Subjective:    Patient ID: Autumn Ross, female    DOB: July 08, 1961, 58 y.o.   MRN: [id number]  HPI: Autumn Ross is a 58 y.o. female presenting on 08/11/2019 for Hypertension (6 month follow up ) and Medical Management of Chronic Issues  The patient comes in for recheck on her chronic medical conditions that do include hypertension, depression and anxiety.  She does need refills on her medication for depression anxiety.  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  Everyone is currently doing all of their school from home.  She is under significant stress with this.  She states that her asthma has been fairly stable.  There are no other new issues.  We have discussed ways to hopefully get co-pay coupons for her because her insurance does not have a low co-pay for some of her inhalers.  Past Medical History:  Diagnosis Date  . Asthma   . GERD (gastroesophageal reflux disease)   . Hypertension    Relevant past medical, surgical, family and social history reviewed and updated as indicated. Interim medical history since our last visit reviewed. Allergies and medications reviewed and updated. DATA REVIEWED: CHART IN EPIC  Family History reviewed for pertinent findings.  Review of Systems  Constitutional: Negative.   HENT: Negative.   Eyes: Negative.   Respiratory: Negative.   Gastrointestinal: Negative.   Genitourinary: Negative.     Allergies as of 08/11/2019   No Known Allergies     Medication List       Accurate as of August 11, 2019 11:59 PM. If you have any questions, ask your nurse or doctor.        albuterol 108 (90 Base) MCG/ACT inhaler Commonly known as: VENTOLIN HFA Inhale 2 puffs into the lungs every 6 (six) hours as needed for wheezing or  shortness of breath.   azelastine 0.05 % ophthalmic solution Commonly known as: OPTIVAR   buPROPion 150 MG 12 hr tablet Commonly known as: WELLBUTRIN SR Take 1 tablet (150 mg total) by mouth 2 (two) times daily.   busPIRone 10 MG tablet Commonly known as: BUSPAR Take 1 tablet (10 mg total) by mouth 2 (two) times daily. Started by: August 13, 2019, PA-C   esomeprazole 20 MG capsule Commonly known as: NEXIUM Take 1 capsule (20 mg total) by mouth daily.   lisinopril 40 MG tablet Commonly known as: ZESTRIL Take 1 tablet (40 mg total) by mouth daily.   Symbicort 160-4.5 MCG/ACT inhaler Generic drug: budesonide-formoterol Inhale 2 puffs into the lungs 2 (two) times daily.          Objective:    BP 138/75   Pulse 72   Temp (!) 97.5 F (36.4 C) (Temporal)   Ht 5\' 3"  (1.6 m)   Wt 190 lb 9.6 oz (86.5 kg)   SpO2 99%   BMI 33.76 kg/m   No Known Allergies  Wt Readings from Last 3 Encounters:  08/11/19 190 lb 9.6 oz (86.5 kg)  01/27/19 189 lb 12.8 oz (86.1 kg)  01/25/18 180 lb 12.8 oz (82 kg)    Physical Exam Constitutional:      General: She is not in acute  distress.    Appearance: Normal appearance. She is well-developed.  HENT:     Head: Normocephalic and atraumatic.  Cardiovascular:     Rate and Rhythm: Normal rate.  Pulmonary:     Effort: Pulmonary effort is normal.  Skin:    General: Skin is warm and dry.     Findings: No rash.  Neurological:     Mental Status: She is alert and oriented to person, place, and time.     Deep Tendon Reflexes: Reflexes are normal and symmetric.     No results found for this or any previous visit.    Assessment & Plan:   1. Depression, recurrent (HCC) - buPROPion (WELLBUTRIN SR) 150 MG 12 hr tablet; Take 1 tablet (150 mg total) by mouth 2 (two) times daily.  Dispense: 180 tablet; Refill: 3  2. Anxiety - busPIRone (BUSPAR) 10 MG tablet; Take 1 tablet (10 mg total) by mouth 2 (two) times daily.  Dispense: 60 tablet; Refill:  11   Continue all other maintenance medications as listed above.  Follow up plan: No follow-ups on file.  Educational handout given for stress  Terald Sleeper PA-C [facility] [address]  [place], [place] [address] [phone]   08/17/2019, 3:08 PM

## 2019-08-20 ENCOUNTER — Other Ambulatory Visit: Payer: Self-pay | Admitting: Physician Assistant

## 2019-08-20 MED ORDER — DULERA 100-5 MCG/ACT IN AERO: 2.0000 | INHALATION_SPRAY | Freq: Two times a day (BID) | RESPIRATORY_TRACT | 11 refills | Status: AC

## 2019-08-20 NOTE — Telephone Encounter (Signed)
Pt aware by VM  

## 2019-08-20 NOTE — Telephone Encounter (Signed)
Sent a prescription for [facility].  I think I gave her a coupon print out when she was in the office for that.

## 2019-08-24 ENCOUNTER — Other Ambulatory Visit: Payer: Self-pay | Admitting: Physician Assistant

## 2019-08-24 DIAGNOSIS — F419 Anxiety disorder, unspecified: Secondary | ICD-10-CM

## 2019-09-14 DIAGNOSIS — Z20828 Contact with and (suspected) exposure to other viral communicable diseases: Secondary | ICD-10-CM | POA: Diagnosis not present

## 2020-01-12 ENCOUNTER — Encounter: Payer: Self-pay | Admitting: *Deleted

## 2020-02-10 ENCOUNTER — Ambulatory Visit: Payer: Self-pay | Admitting: Physician Assistant

## 2020-02-10 ENCOUNTER — Ambulatory Visit: Payer: BC Managed Care – PPO | Admitting: Nurse Practitioner

## 2020-03-18 ENCOUNTER — Other Ambulatory Visit: Payer: Self-pay | Admitting: *Deleted

## 2020-03-18 DIAGNOSIS — K219 Gastro-esophageal reflux disease without esophagitis: Secondary | ICD-10-CM

## 2020-04-23 ENCOUNTER — Other Ambulatory Visit: Payer: Self-pay | Admitting: *Deleted

## 2020-04-23 DIAGNOSIS — I10 Essential (primary) hypertension: Secondary | ICD-10-CM

## 2020-04-23 MED ORDER — LISINOPRIL 40 MG PO TABS: 40.0000 mg | ORAL_TABLET | Freq: Every day | ORAL | 0 refills | Status: AC

## 2020-04-26 DIAGNOSIS — Z13220 Encounter for screening for lipoid disorders: Secondary | ICD-10-CM | POA: Diagnosis not present

## 2020-04-26 DIAGNOSIS — Z1211 Encounter for screening for malignant neoplasm of colon: Secondary | ICD-10-CM | POA: Diagnosis not present

## 2020-04-26 DIAGNOSIS — Z0000 Encounter for general adult medical examination without abnormal findings: Secondary | ICD-10-CM | POA: Diagnosis not present

## 2020-04-26 DIAGNOSIS — Z1231 Encounter for screening mammogram for malignant neoplasm of breast: Secondary | ICD-10-CM | POA: Diagnosis not present

## 2020-04-26 DIAGNOSIS — F339 Major depressive disorder, recurrent, unspecified: Secondary | ICD-10-CM | POA: Diagnosis not present

## 2020-04-28 DIAGNOSIS — Z23 Encounter for immunization: Secondary | ICD-10-CM | POA: Diagnosis not present

## 2020-05-20 DIAGNOSIS — Z23 Encounter for immunization: Secondary | ICD-10-CM | POA: Diagnosis not present
